# Patient Record
Sex: Male | Born: 1985 | Hispanic: Yes | Marital: Single | Smoking: Never smoker
Health system: Southern US, Community
[De-identification: ages and names within clinical notes are randomized; demographics above are authoritative.]

## PROBLEM LIST (undated history)

## (undated) DIAGNOSIS — K219 Gastro-esophageal reflux disease without esophagitis: Secondary | ICD-10-CM

## (undated) DIAGNOSIS — E7800 Pure hypercholesterolemia, unspecified: Secondary | ICD-10-CM

---

## 2019-01-09 ENCOUNTER — Emergency Department (HOSPITAL_COMMUNITY): Payer: BC Managed Care – PPO

## 2019-01-09 ENCOUNTER — Other Ambulatory Visit: Payer: Self-pay

## 2019-01-09 ENCOUNTER — Emergency Department (HOSPITAL_COMMUNITY)
Admission: EM | Admit: 2019-01-09 | Discharge: 2019-01-09 | Disposition: A | Discharge status: Home / Self Care | Payer: BC Managed Care – PPO | Attending: Emergency Medicine | Admitting: Emergency Medicine

## 2019-01-09 ENCOUNTER — Encounter (HOSPITAL_COMMUNITY): Payer: Self-pay | Admitting: Emergency Medicine

## 2019-01-09 DIAGNOSIS — R079 Chest pain, unspecified: Secondary | ICD-10-CM | POA: Insufficient documentation

## 2019-01-09 DIAGNOSIS — Z5321 Procedure and treatment not carried out due to patient leaving prior to being seen by health care provider: Secondary | ICD-10-CM | POA: Insufficient documentation

## 2019-01-09 LAB — BASIC METABOLIC PANEL
Anion gap: 11 (ref 5–15)
BUN: 10 mg/dL (ref 6–20)
CO2: 25 mmol/L (ref 22–32)
Calcium: 10.1 mg/dL (ref 8.9–10.3)
Chloride: 101 mmol/L (ref 98–111)
Creatinine, Ser: 0.96 mg/dL (ref 0.61–1.24)
GFR calc Af Amer: >60 mL/min (ref >60)
GFR calc non Af Amer: >60 mL/min (ref >60)
Glucose, Bld: 97 mg/dL (ref 70–99)
Potassium: 3.8 mmol/L (ref 3.5–5.1)
Sodium: 137 mmol/L (ref 135–145)

## 2019-01-09 LAB — TROPONIN I (HIGH SENSITIVITY)
Troponin I (High Sensitivity): <2 ng/L (ref <18)
Troponin I (High Sensitivity): <2 ng/L (ref <18)

## 2019-01-09 LAB — CBC
HCT: 43 % (ref 39.0–52.0)
Hemoglobin: 14.7 g/dL (ref 13.0–17.0)
MCH: 29.3 pg (ref 26.0–34.0)
MCHC: 34.2 g/dL (ref 30.0–36.0)
MCV: 85.7 fL (ref 80.0–100.0)
Platelets: 215 10*3/uL (ref 150–400)
RBC: 5.02 MIL/uL (ref 4.22–5.81)
RDW: 12.5 % (ref 11.5–15.5)
WBC: 6.4 10*3/uL (ref 4.0–10.5)
nRBC: 0 % (ref 0.0–0.2)

## 2019-01-09 MED ORDER — SODIUM CHLORIDE 0.9% FLUSH: 3.0000 mL | Freq: Once | INTRAVENOUS | Status: DC

## 2019-01-09 NOTE — ED Notes (Signed)
Pt called for vitals, no response. 

## 2019-01-09 NOTE — ED Triage Notes (Signed)
Patient with week long history of chest pain, radiating down left arm.  He states that he was tested for Covid and was negative and his symptoms of chest pain and shortness of breath persisted.  No nausea or vomiting.

## 2019-01-21 ENCOUNTER — Ambulatory Visit
Admission: EM | Admit: 2019-01-21 | Discharge: 2019-01-21 | Disposition: A | Discharge status: Home / Self Care | Payer: BC Managed Care – PPO | Attending: Emergency Medicine | Admitting: Emergency Medicine

## 2019-01-21 ENCOUNTER — Other Ambulatory Visit: Payer: Self-pay

## 2019-01-21 ENCOUNTER — Encounter: Payer: Self-pay | Admitting: Emergency Medicine

## 2019-01-21 DIAGNOSIS — R0789 Other chest pain: Secondary | ICD-10-CM | POA: Diagnosis not present

## 2019-01-21 DIAGNOSIS — R0602 Shortness of breath: Secondary | ICD-10-CM

## 2019-01-21 HISTORY — DX: Pure hypercholesterolemia, unspecified: E78.00

## 2019-01-21 MED ORDER — ALUM & MAG HYDROXIDE-SIMETH 200-200-20 MG/5ML PO SUSP
30.0000 mL | Freq: Once | ORAL | Status: AC
  Administered 2019-01-21: 13:00:00 30 mL via ORAL

## 2019-01-21 MED ORDER — OMEPRAZOLE 20 MG PO CPDR: 20.0000 mg | DELAYED_RELEASE_CAPSULE | Freq: Every day | ORAL | 1 refills | Status: DC

## 2019-01-21 MED ORDER — LIDOCAINE VISCOUS HCL 2 % MT SOLN
15.0000 mL | Freq: Once | OROMUCOSAL | Status: AC
  Administered 2019-01-21: 13:00:00 15 mL via ORAL

## 2019-01-21 NOTE — Discharge Instructions (Addendum)
Take omeprazole as prescribed. Keep symptom log to bring to PCP for further evaluation/management. Go to ER if you develop worsening chest pain, difficulty breathing, nausea, vomiting, lightheadedness.

## 2019-01-21 NOTE — ED Notes (Signed)
Patient able to ambulate independently  

## 2019-01-21 NOTE — ED Provider Notes (Signed)
[facility]    CSN: [id number] Arrival date & time: 01/21/19  1236     History   Chief Complaint Chief Complaint  Patient presents with  . Chest Pain    HPI Juan Carpenter is a 33 y.o. male without significant medical history presenting for 3-week course of intermittent, retrosternal chest pressure associated with shortness of breath and radiation to left arm.  Patient states that these symptoms seem to be exacerbated particularly after eating a large dinner and then reclining 1 to 2 hours later.  Patient also endorsing increased eructation.  Patient denies history of GERD, hernia: Not currently taking anything for symptoms.  Patient underwent COVID testing at an outside facility which was negative due to patient is concerned that the difficulty breathing he was experiencing was related to coronavirus.  Patient denies fever, cough, known sick contacts.  Of note, patient went to the ER for similar symptoms on 7/25, though left prior to being seen due to wait time.  Labs were drawn: Troponin < 2, x 2: No EKG done.  Patient denies family history of coronary events or disease.   Past Medical History:  Diagnosis Date  . Hypercholesteremia     There are no active problems to display for this patient.   History reviewed. No pertinent surgical history.     Home Medications    Prior to Admission medications   Medication Sig Start Date End Date Taking? Authorizing Provider  omeprazole (PRILOSEC) 20 MG capsule Take 1 capsule (20 mg total) by mouth daily. 01/21/19   Hall-Potvin, [place], PA-C    Family History History reviewed. No pertinent family history.  Social History Social History   Tobacco Use  . Smoking status: Never Smoker  . Smokeless tobacco: Never Used  Substance Use Topics  . Alcohol use: Yes    Comment: sometimes  . Drug use: Never     Allergies   Patient has no known allergies.   Review of Systems Review of Systems  Constitutional: Negative  for appetite change, fatigue and fever.  HENT: Negative for congestion, sinus pain, sore throat, trouble swallowing and voice change.   Respiratory: Positive for shortness of breath. Negative for cough.   Cardiovascular: Positive for chest pain. Negative for palpitations.  Gastrointestinal: Negative for abdominal pain, diarrhea and vomiting.  Musculoskeletal: Negative for arthralgias and myalgias.  Skin: Negative for rash and wound.  Neurological: Negative for speech difficulty and headaches.  All other systems reviewed and are negative.    Physical Exam Triage Vital Signs ED Triage Vitals [01/21/19 1247]  Enc Vitals Group     BP 116/72     Pulse Rate 74     Resp 18     Temp 98.3 F (36.8 C)     Temp Source Oral     SpO2 98 %     Weight      Height      Head Circumference      Peak Flow      Pain Score 3     Pain Loc      Pain Edu?      Excl. in [place]?    No data found.  Updated Vital Signs BP 116/72 (BP Location: Left Arm)   Pulse 74   Temp 98.3 F (36.8 C) (Oral)   Resp 18   SpO2 98%   Visual Acuity Right Eye Distance:   Left Eye Distance:   Bilateral Distance:    Right Eye Near:   Left Eye  Near:    Bilateral Near:     Physical Exam Constitutional:      General: He is not in acute distress. HENT:     Head: Normocephalic and atraumatic.  Eyes:     General: No scleral icterus.    Pupils: Pupils are equal, round, and reactive to light.  Cardiovascular:     Rate and Rhythm: Normal rate and regular rhythm.     Pulses:          Carotid pulses are 2+ on the right side and 2+ on the left side.    Heart sounds: Normal heart sounds. No murmur.  Pulmonary:     Effort: Pulmonary effort is normal. No respiratory distress.     Breath sounds: Normal breath sounds.  Abdominal:     General: Bowel sounds are normal.     Palpations: Abdomen is soft. There is no splenomegaly.     Tenderness: There is no abdominal tenderness. There is no guarding.  Skin:    Capillary  Refill: Capillary refill takes less than 2 seconds.     Coloration: Skin is not cyanotic, jaundiced or pale.  Neurological:     General: No focal deficit present.     Mental Status: He is alert and oriented to person, place, and time.      UC Treatments / Results  Labs (all labs ordered are listed, but only abnormal results are displayed) Labs Reviewed - No data to display  EKG   Radiology No results found.  Procedures Procedures (including critical care time)  Medications Ordered in UC Medications  alum & mag hydroxide-simeth (MAALOX/MYLANTA) 200-200-20 MG/5ML suspension 30 mL (30 mLs Oral Given 01/21/19 1326)    And  lidocaine (XYLOCAINE) 2 % viscous mouth solution 15 mL (15 mLs Oral Given 01/21/19 1326)    Initial Impression / Assessment and Plan / UC Course  I have reviewed the triage vital signs and the nursing notes.  Pertinent labs & imaging results that were available during my care of the patient were reviewed by me and considered in my medical decision making (see chart for details).     1.  Other chest pain With low risk for ACS, history consistent with GERD.  EKG done in office, reviewed by me without previous to compare: Normal sinus rhythm with T wave inversion in lead III without reciprocal changes.  No ST elevation or QTc prolongation.  Patient given GI cocktail with lidocaine with improvement of symptoms.  Will trial PPI, have patient keep symptom log as below and follow-up with PCP.  Return precautions discussed, patient verbalized understanding and is agreeable to plan. Final Clinical Impressions(s) / UC Diagnoses   Final diagnoses:  Other chest pain     Discharge Instructions     Take omeprazole as prescribed. Keep symptom log to bring to PCP for further evaluation/management. Go to ER if you develop worsening chest pain, difficulty breathing, nausea, vomiting, lightheadedness.    ED Prescriptions    Medication Sig Dispense Auth. Provider    omeprazole (PRILOSEC) 20 MG capsule Take 1 capsule (20 mg total) by mouth daily. 30 capsule Hall-Potvin, [place], PA-C     Controlled Substance Prescriptions [place] Controlled Substance Registry consulted? Not Applicable   Shea EvansHall-Potvin, [place], [place]-C 01/21/19 1435

## 2019-01-21 NOTE — ED Triage Notes (Addendum)
Pt presents to [facility] for assessment after having a 3 day episode of CP and SOB 2-3 weeks ago.  Patient states he began to feel better, with no SOB, but pain in his chest stayed with left arm pain.  Pt states its intermittent episode of chest pain with left arm pain, way worse at night, especially with laying down, and has difficulty sleeping because of it.  Patient seen in ER previously for the same, but left AMA due to wait time.    Pt denies SOB, n/v, near-syncope.  C/o very mild dizziness once a day or so.  Pt c/o an increase in gas and acid, which seems to impact the pain in his chest and arm and sometimes in his back.

## 2019-04-02 ENCOUNTER — Other Ambulatory Visit: Payer: Self-pay | Admitting: Physician Assistant

## 2019-04-02 DIAGNOSIS — K219 Gastro-esophageal reflux disease without esophagitis: Secondary | ICD-10-CM

## 2019-04-02 DIAGNOSIS — K8020 Calculus of gallbladder without cholecystitis without obstruction: Secondary | ICD-10-CM

## 2019-04-05 ENCOUNTER — Ambulatory Visit: Payer: BC Managed Care – PPO

## 2019-04-07 ENCOUNTER — Ambulatory Visit
Admission: RE | Admit: 2019-04-07 | Discharge: 2019-04-07 | Disposition: A | Discharge status: Home / Self Care | Payer: BC Managed Care – PPO | Source: Ambulatory Visit | Attending: Physician Assistant | Admitting: Physician Assistant

## 2019-04-07 DIAGNOSIS — K219 Gastro-esophageal reflux disease without esophagitis: Secondary | ICD-10-CM

## 2019-08-24 ENCOUNTER — Other Ambulatory Visit: Payer: Self-pay | Admitting: Physician Assistant

## 2019-08-24 DIAGNOSIS — R1084 Generalized abdominal pain: Secondary | ICD-10-CM

## 2019-09-02 ENCOUNTER — Ambulatory Visit: Payer: BC Managed Care – PPO | Attending: Internal Medicine

## 2019-09-02 DIAGNOSIS — Z23 Encounter for immunization: Secondary | ICD-10-CM

## 2019-09-02 NOTE — Progress Notes (Signed)
   Covid-19 Vaccination Clinic  Name:  Juan Carpenter    MRN: [id number] DOB: 1985/10/07  09/02/2019  Juan Carpenter was observed post Covid-19 immunization for 15 minutes without incident. He was provided with Vaccine Information Sheet and instruction to access the V-Safe system.   Juan Carpenter was instructed to call 911 with any severe reactions post vaccine: Marland Kitchen Difficulty breathing  . Swelling of face and throat  . A fast heartbeat  . A bad rash all over body  . Dizziness and weakness   Immunizations Administered    Name Date Dose VIS Date Route   Pfizer COVID-19 Vaccine 09/02/2019  4:50 PM 0.3 mL 05/28/2019 Intramuscular   Manufacturer: [employer], [employer]   Lot: [id number]   NDC: [address]-9

## 2019-09-24 ENCOUNTER — Inpatient Hospital Stay: Admission: RE | Admit: 2019-09-24 | Payer: BC Managed Care – PPO | Source: Ambulatory Visit

## 2019-09-24 ENCOUNTER — Other Ambulatory Visit: Payer: BC Managed Care – PPO

## 2019-09-29 ENCOUNTER — Ambulatory Visit: Payer: BC Managed Care – PPO | Attending: Internal Medicine

## 2019-09-29 DIAGNOSIS — Z23 Encounter for immunization: Secondary | ICD-10-CM

## 2019-09-29 NOTE — Progress Notes (Signed)
   Covid-19 Vaccination Clinic  Name:  Juan Carpenter    MRN: [id number] DOB: [birth year]  09/29/2019  [name] was observed post Covid-19 immunization for 15 minutes without incident. He was provided with Vaccine Information Sheet and instruction to access the V-Safe system.   [name] was instructed to call 911 with any severe reactions post vaccine: Marland Kitchen Difficulty breathing  . Swelling of face and throat  . A fast heartbeat  . A bad rash all over body  . Dizziness and weakness   Immunizations Administered    Name Date Dose VIS Date Route   Pfizer COVID-19 Vaccine 09/29/2019  4:55 PM 0.3 mL 05/28/2019 Intramuscular   Manufacturer: [employer], [employer]   Lot: [id number]   NDC: [address]-2

## 2019-10-18 ENCOUNTER — Other Ambulatory Visit: Payer: Self-pay

## 2019-10-18 ENCOUNTER — Ambulatory Visit
Admission: RE | Admit: 2019-10-18 | Discharge: 2019-10-18 | Disposition: A | Discharge status: Home / Self Care | Payer: BC Managed Care – PPO | Source: Ambulatory Visit | Attending: Physician Assistant | Admitting: Physician Assistant

## 2019-10-18 DIAGNOSIS — R1084 Generalized abdominal pain: Secondary | ICD-10-CM

## 2019-10-18 MED ORDER — IOPAMIDOL (ISOVUE-300) INJECTION 61%
100.0000 mL | Freq: Once | INTRAVENOUS | Status: AC | PRN
  Administered 2019-10-18: 100 mL via INTRAVENOUS

## 2020-03-02 ENCOUNTER — Ambulatory Visit (HOSPITAL_COMMUNITY)
Admission: EM | Admit: 2020-03-02 | Discharge: 2020-03-02 | Disposition: A | Discharge status: Home / Self Care | Payer: BC Managed Care – PPO | Attending: Family Medicine | Admitting: Family Medicine

## 2020-03-02 ENCOUNTER — Other Ambulatory Visit: Payer: Self-pay

## 2020-03-02 ENCOUNTER — Encounter (HOSPITAL_COMMUNITY): Payer: Self-pay | Admitting: *Deleted

## 2020-03-02 DIAGNOSIS — Z20822 Contact with and (suspected) exposure to covid-19: Secondary | ICD-10-CM | POA: Insufficient documentation

## 2020-03-02 DIAGNOSIS — R197 Diarrhea, unspecified: Secondary | ICD-10-CM | POA: Diagnosis present

## 2020-03-02 DIAGNOSIS — R1013 Epigastric pain: Secondary | ICD-10-CM | POA: Diagnosis not present

## 2020-03-02 DIAGNOSIS — K219 Gastro-esophageal reflux disease without esophagitis: Secondary | ICD-10-CM | POA: Diagnosis not present

## 2020-03-02 DIAGNOSIS — E7800 Pure hypercholesterolemia, unspecified: Secondary | ICD-10-CM | POA: Insufficient documentation

## 2020-03-02 DIAGNOSIS — Z79899 Other long term (current) drug therapy: Secondary | ICD-10-CM | POA: Insufficient documentation

## 2020-03-02 HISTORY — DX: Gastro-esophageal reflux disease without esophagitis: K21.9

## 2020-03-02 LAB — SARS CORONAVIRUS 2 (TAT 6-24 HRS): SARS Coronavirus 2: NEGATIVE

## 2020-03-02 MED ORDER — SIMETHICONE 80 MG PO CHEW: 80.0000 mg | CHEWABLE_TABLET | Freq: Four times a day (QID) | ORAL | 0 refills | Status: AC | PRN

## 2020-03-02 MED ORDER — SUCRALFATE 1 G PO TABS: 1.0000 g | ORAL_TABLET | Freq: Three times a day (TID) | ORAL | 0 refills | Status: AC

## 2020-03-02 MED ORDER — PANTOPRAZOLE SODIUM 40 MG PO TBEC: 40.0000 mg | DELAYED_RELEASE_TABLET | Freq: Two times a day (BID) | ORAL | 0 refills | Status: AC

## 2020-03-02 NOTE — Discharge Instructions (Signed)
Follow up with your GI specialist if your symptoms do not improve

## 2020-03-02 NOTE — ED Provider Notes (Signed)
[facility]    CSN: [id number] Arrival date & time: 03/02/20  0818      History   Chief Complaint Chief Complaint  Patient presents with  . Abdominal Pain  . Diarrhea    HPI Juan Carpenter is a 34 y.o. male.   Upper abdominal pain, bloating, belching/gas, and diarrhea x 2 days. Having bouts of diarrhea x 6-7 times daily, typically any time he eats. Daughters and wife sick with similar sxs. Took one prilosec the first day but ran out and notes this never really helped him. Does have a 1.5 year hx of GI issues for which he's been evaluated thoroughly. Has been treated with resolution for H pylori in that time frame but notes sxs never fully resolved.      Past Medical History:  Diagnosis Date  . GERD (gastroesophageal reflux disease)   . Hypercholesteremia     There are no problems to display for this patient.   History reviewed. No pertinent surgical history.     Home Medications    Prior to Admission medications   Medication Sig Start Date End Date Taking? Authorizing Provider  pantoprazole (PROTONIX) 40 MG tablet Take 1 tablet (40 mg total) by mouth 2 (two) times daily before a meal. 03/02/20   Maurice March, [place] Hews, PA-C  simethicone (GAS-X) 80 MG chewable tablet Chew 1 tablet (80 mg total) by mouth every 6 (six) hours as needed for flatulence. 03/02/20   Particia Nearing, PA-C  sucralfate (CARAFATE) 1 g tablet Take 1 tablet (1 g total) by mouth 4 (four) times daily -  with meals and at bedtime. May dissolve in glass of water and drink 03/02/20   Particia Nearing, PA-C    Family History Family History  Problem Relation Age of Onset  . Healthy Mother   . Healthy Father   . Healthy Sister   . Healthy Brother     Social History Social History   Tobacco Use  . Smoking status: Never Smoker  . Smokeless tobacco: Never Used  Vaping Use  . Vaping Use: Never used  Substance Use Topics  . Alcohol use: Not Currently    Comment: sometimes   . Drug use: Never     Allergies   Shrimp [shellfish allergy]   Review of Systems Review of Systems PER HPI  Physical Exam Triage Vital Signs ED Triage Vitals  Enc Vitals Group     BP 03/02/20 0922 (!) 107/37     Pulse Rate 03/02/20 0922 70     Resp 03/02/20 0922 16     Temp 03/02/20 0922 98.4 F (36.9 C)     Temp Source 03/02/20 0922 Oral     SpO2 03/02/20 0922 99 %     Weight --      Height --      Head Circumference --      Peak Flow --      Pain Score 03/02/20 0921 8     Pain Loc --      Pain Edu? --      Excl. in [place]? --    No data found.  Updated Vital Signs BP (!) 107/37 (BP Location: Left Arm)   Pulse 70   Temp 98.4 F (36.9 C) (Oral)   Resp 16   SpO2 99%   Visual Acuity Right Eye Distance:   Left Eye Distance:   Bilateral Distance:    Right Eye Near:   Left Eye Near:    Bilateral Near:  Physical Exam Vitals and nursing note reviewed.  Constitutional:      Appearance: Normal appearance.  HENT:     Head: Atraumatic.  Eyes:     Extraocular Movements: Extraocular movements intact.     Conjunctiva/sclera: Conjunctivae normal.  Cardiovascular:     Rate and Rhythm: Normal rate and regular rhythm.  Pulmonary:     Effort: Pulmonary effort is normal.     Breath sounds: Normal breath sounds.  Abdominal:     General: Bowel sounds are normal. There is no distension.     Palpations: Abdomen is soft.     Tenderness: There is abdominal tenderness (generalized ttp, worse in epigastric and LUQ ). There is no right CVA tenderness, left CVA tenderness or guarding.  Musculoskeletal:        General: Normal range of motion.     Cervical back: Normal range of motion and neck supple.  Skin:    General: Skin is warm and dry.  Neurological:     General: No focal deficit present.     Mental Status: He is oriented to person, place, and time.  Psychiatric:        Mood and Affect: Mood normal.        Thought Content: Thought content normal.         Judgment: Judgment normal.    UC Treatments / Results  Labs (all labs ordered are listed, but only abnormal results are displayed) Labs Reviewed  SARS CORONAVIRUS 2 (TAT 6-24 HRS)   EKG   Radiology No results found.  Procedures Procedures (including critical care time)  Medications Ordered in UC Medications - No data to display  Initial Impression / Assessment and Plan / UC Course  I have reviewed the triage vital signs and the nursing notes.  Pertinent labs & imaging results that were available during my care of the patient were reviewed by me and considered in my medical decision making (see chart for details).     Given multiple other sick contacts in the home with similar sxs suspect viral etiology of his exacerbation of GI sxs. Will r/o COVID, results pending and note given for work to isolate until results return/sxs resolve. Rx sent for protonix, carafate, and simethicone for symptomatic relief. BRAT diet, push fluids, probiotics, f/u with GI specialist if not resolving or worsening.   Final Clinical Impressions(s) / UC Diagnoses   Final diagnoses:  Diarrhea, unspecified type  Abdominal pain, epigastric     Discharge Instructions     Follow up with your GI specialist if your symptoms do not improve    ED Prescriptions    Medication Sig Dispense Auth. Provider   pantoprazole (PROTONIX) 40 MG tablet Take 1 tablet (40 mg total) by mouth 2 (two) times daily before a meal. 60 tablet Particia Nearing, PA-C   sucralfate (CARAFATE) 1 g tablet Take 1 tablet (1 g total) by mouth 4 (four) times daily -  with meals and at bedtime. May dissolve in glass of water and drink 120 tablet Particia Nearing, [place]   simethicone (GAS-X) 80 MG chewable tablet Chew 1 tablet (80 mg total) by mouth every 6 (six) hours as needed for flatulence. 30 tablet Particia Nearing, [place]     PDMP not reviewed this encounter.   Particia Nearing, [place] 03/02/20 1204

## 2020-03-02 NOTE — ED Triage Notes (Signed)
Patient has complaints of abdominal pain across entire abdomen that radiates to upper back. Patient states he had stomach issues for the past year and half.  Patient states that he feels bloated.Patient has been by GI and had endoscopy and  MRIs previously. Patient states he was told that he had a little gastritis and also had a stool test to test for H. Pylori that was positive and was treated for it but still has a lot of pain. 4 days ago daughter started  Having stomach issues for 2 days and youngest daughter started vomiting and having stomach issues. Pt's wife also began to have abdominal issues. Patient states on Tuesday he started to experience an increase in abdominal pain and diarrhea with a lot of gas. Patient states he has discomfort and rates it at 8 now but at night pain increases. Patient states that he has been taking omeprazole daily and antacids but has not experienced any relief. Patient reports diarrhea for the past 2 days but none today. Patient states he experienced diarrhea 6-7 times/day for the past two days. Pt denies an fevers. Patient states he has experienced a little SOB but thinks it is related to feeling bloated and having gas.

## 2020-09-28 ENCOUNTER — Other Ambulatory Visit: Payer: Self-pay | Admitting: Physician Assistant

## 2020-09-28 DIAGNOSIS — R1013 Epigastric pain: Secondary | ICD-10-CM

## 2020-09-28 DIAGNOSIS — K869 Disease of pancreas, unspecified: Secondary | ICD-10-CM

## 2020-10-14 ENCOUNTER — Ambulatory Visit
Admission: RE | Admit: 2020-10-14 | Discharge: 2020-10-14 | Disposition: A | Discharge status: Home / Self Care | Payer: BC Managed Care – PPO | Source: Ambulatory Visit | Attending: Physician Assistant | Admitting: Physician Assistant

## 2020-10-14 DIAGNOSIS — R1013 Epigastric pain: Secondary | ICD-10-CM

## 2020-10-14 DIAGNOSIS — K869 Disease of pancreas, unspecified: Secondary | ICD-10-CM

## 2020-10-14 MED ORDER — GADOBENATE DIMEGLUMINE 529 MG/ML IV SOLN
12.0000 mL | Freq: Once | INTRAVENOUS | Status: AC | PRN
  Administered 2020-10-14: 12 mL via INTRAVENOUS

## 2021-01-10 IMAGING — CT CT ABDOMEN W/ CM
1 of 2 series · 12 of 32 positions shown, 18 images · IV contrast (APPLIED)
Comparison: None.

CLINICAL DATA: Epigastric and mid abdominal pain for 6 months.

EXAM:
CT ABDOMEN WITH CONTRAST
TECHNIQUE: Multidetector CT imaging of the abdomen was performed using the
standard protocol following bolus administration of intravenous
contrast.
CONTRAST:  100mL [id number] IOPAMIDOL ([id number]) INJECTION 61%

[Series 2: abdroutine 5.0 i40s 1 · axial · 0.70mm/px · z∈[-276,-56]mm · 12 of 54 slices shown, 18 images]
[im 5/54  soft-tissue]
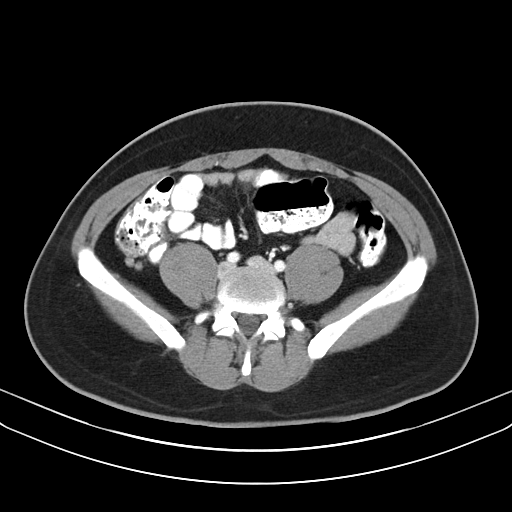
[im 5/54  bone]
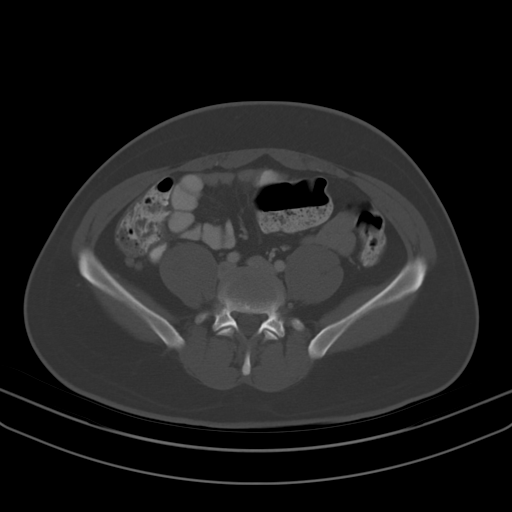
[im 9/54  soft-tissue]
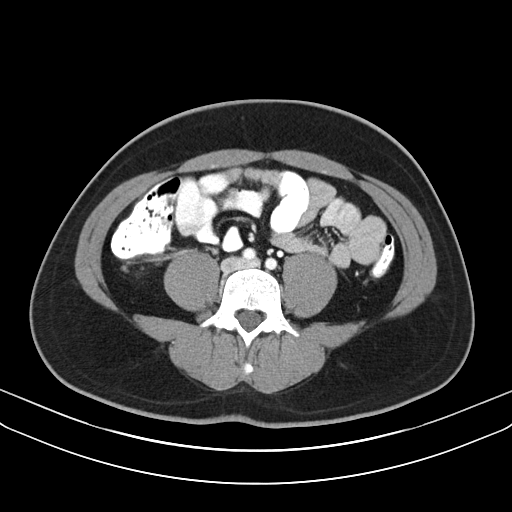
[im 13/54  soft-tissue]
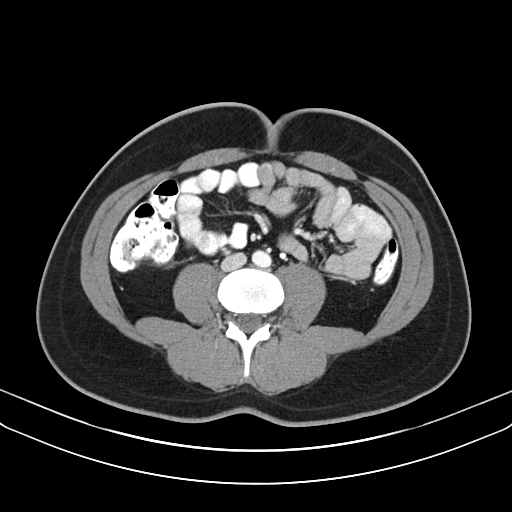
[im 17/54  soft-tissue]
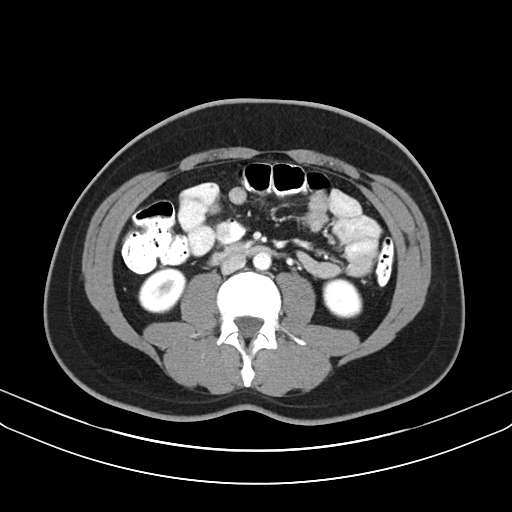
[im 21/54  soft-tissue]
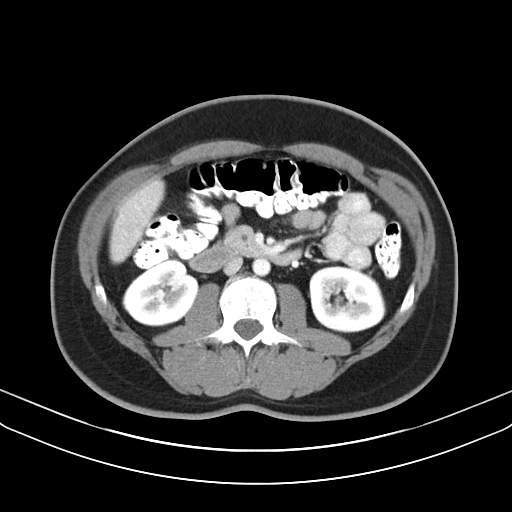
[im 25/54  soft-tissue]
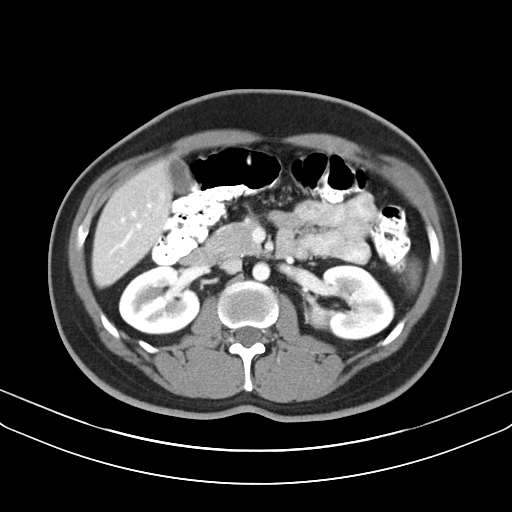
[im 29/54  soft-tissue]
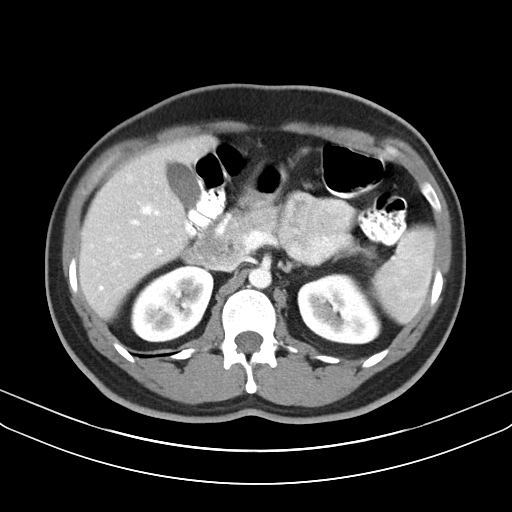
[im 33/54  soft-tissue]
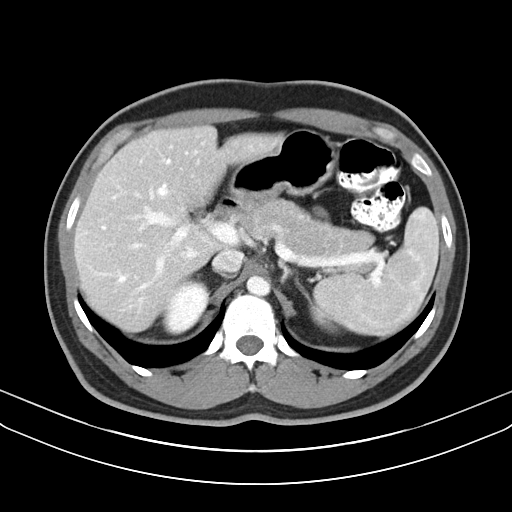
[im 37/54  soft-tissue]
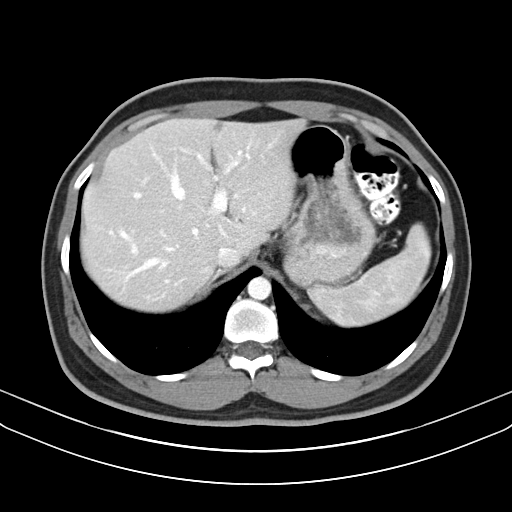
[im 37/54  lung]
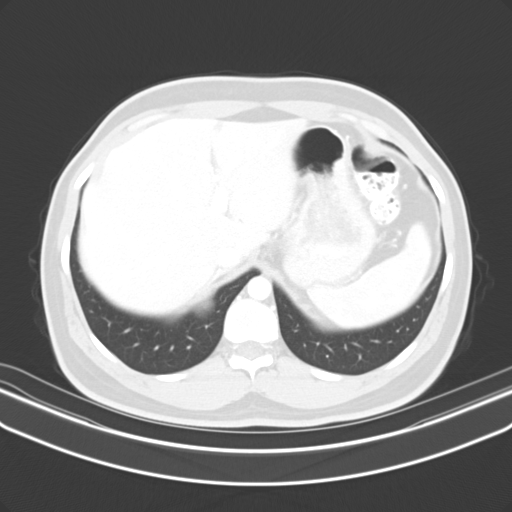
[im 37/54  bone]
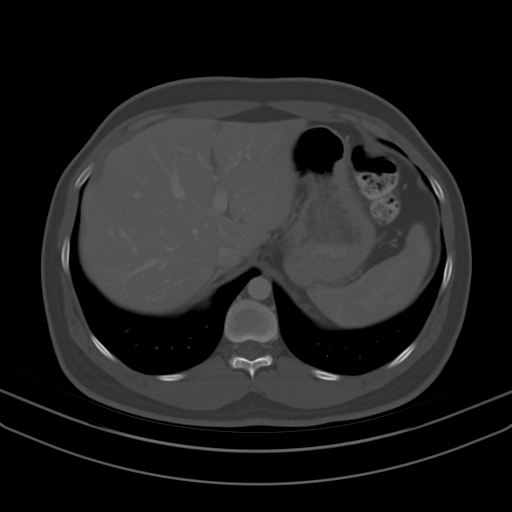
[im 41/54  soft-tissue]
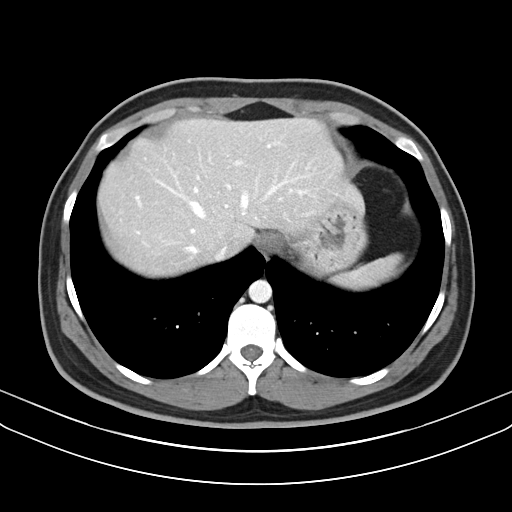
[im 41/54  lung]
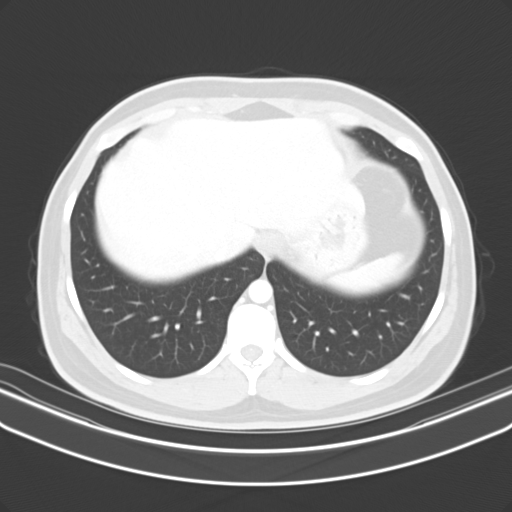
[im 45/54  soft-tissue]
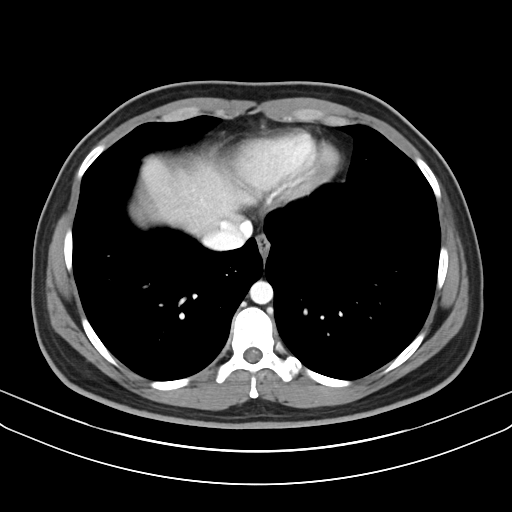
[im 45/54  lung]
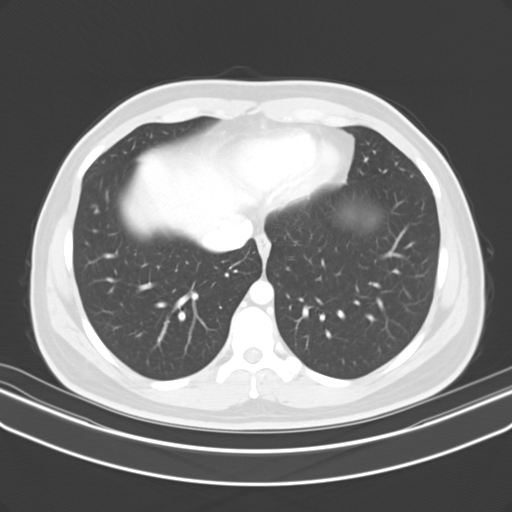
[im 49/54  soft-tissue]
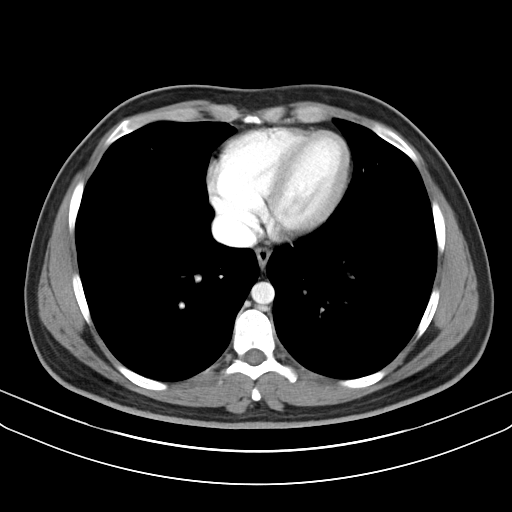
[im 49/54  lung]
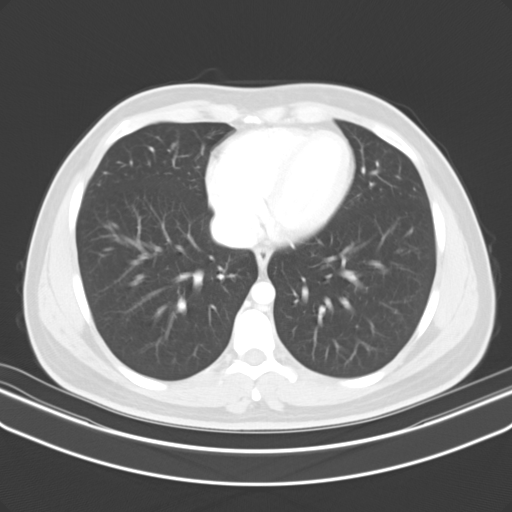

[12 of 32 positions shown; findings below may reference images not displayed]

FINDINGS: Lower Chest: No acute findings.

Hepatobiliary: No hepatic masses identified. Gallbladder is
unremarkable. No evidence of biliary ductal dilatation.

Pancreas: A 5 mm cystic focus is seen in the pancreatic uncinate
process. No other pancreatic lesions identified. No evidence of
pancreatic ductal dilatation.

Spleen: Within normal limits in size and appearance.

Adrenals/Urinary Tract: No masses identified. No evidence of
ureteral calculi or hydronephrosis.

Stomach/Bowel: No evidence of obstruction, inflammatory process or
abnormal fluid collections.

Vascular/Lymphatic: No pathologically enlarged lymph nodes. No
abdominal aortic aneurysm.

Other:  No evidence of ventral abdominal wall hernia or mass.

Musculoskeletal:  No suspicious bone lesions identified.
IMPRESSION: 1. No acute findings.
2. 5 mm cystic focus in pancreatic uncinate process, likely
representing an indolent cystic pancreatic neoplasm. Given its tiny
size, continued imaging follow-up is recommended in 1 year,
preferably with abdomen MRI without and with contrast. This
recommendation is based on ACR consensus guidelines: Management of
Incidental Pancreatic Cysts: A White Paper of the ACR Incidental
Findings Committee. [HOSPITAL] 0994;[DATE].
